# Patient Record
Sex: Male | Born: 2000 | Race: White | Hispanic: No | Marital: Single | Smoking: Current every day smoker
Health system: Southern US, Community
[De-identification: ages and names within clinical notes are randomized; demographics above are authoritative.]

## PROBLEM LIST (undated history)

## (undated) HISTORY — PX: INNER EAR SURGERY: SHX679

---
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
## 2022-04-13 ENCOUNTER — Encounter: Payer: Self-pay | Admitting: Psychiatry

## 2022-04-13 ENCOUNTER — Ambulatory Visit: Payer: 59 | Admitting: Psychiatry

## 2022-04-13 VITALS — BP 124/83 | HR 80 | Ht 75.0 in | Wt 166.0 lb

## 2022-04-13 DIAGNOSIS — F909 Attention-deficit hyperactivity disorder, unspecified type: Secondary | ICD-10-CM | POA: Diagnosis not present

## 2022-04-13 DIAGNOSIS — F411 Generalized anxiety disorder: Secondary | ICD-10-CM

## 2022-04-13 DIAGNOSIS — F1220 Cannabis dependence, uncomplicated: Secondary | ICD-10-CM

## 2022-04-13 MED ORDER — ESCITALOPRAM OXALATE 10 MG PO TABS: ORAL_TABLET | ORAL | 1 refills | Status: DC
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
## 2022-04-14 ENCOUNTER — Encounter: Payer: Self-pay | Admitting: Psychiatry

## 2022-05-11 ENCOUNTER — Ambulatory Visit (INDEPENDENT_AMBULATORY_CARE_PROVIDER_SITE_OTHER): Payer: Self-pay | Admitting: Psychiatry

## 2022-05-11 ENCOUNTER — Other Ambulatory Visit: Payer: Self-pay | Admitting: Psychiatry

## 2022-05-11 DIAGNOSIS — F411 Generalized anxiety disorder: Secondary | ICD-10-CM

## 2022-05-11 NOTE — Progress Notes (Signed)
No show

## 2022-06-28 ENCOUNTER — Encounter (HOSPITAL_COMMUNITY): Payer: Self-pay | Admitting: Emergency Medicine

## 2022-06-28 ENCOUNTER — Emergency Department (HOSPITAL_COMMUNITY)
Admission: EM | Admit: 2022-06-28 | Discharge: 2022-06-29 | Disposition: A | Discharge status: Home / Self Care | Payer: Managed Care, Other (non HMO) | Attending: Emergency Medicine | Admitting: Emergency Medicine

## 2022-06-28 ENCOUNTER — Emergency Department (HOSPITAL_COMMUNITY): Payer: Managed Care, Other (non HMO)

## 2022-06-28 ENCOUNTER — Other Ambulatory Visit: Payer: Self-pay

## 2022-06-28 DIAGNOSIS — R930 Abnormal findings on diagnostic imaging of skull and head, not elsewhere classified: Secondary | ICD-10-CM | POA: Diagnosis not present

## 2022-06-28 DIAGNOSIS — Y92410 Unspecified street and highway as the place of occurrence of the external cause: Secondary | ICD-10-CM | POA: Insufficient documentation

## 2022-06-28 DIAGNOSIS — R079 Chest pain, unspecified: Secondary | ICD-10-CM | POA: Insufficient documentation

## 2022-06-28 DIAGNOSIS — M25552 Pain in left hip: Secondary | ICD-10-CM | POA: Insufficient documentation

## 2022-06-28 DIAGNOSIS — D72829 Elevated white blood cell count, unspecified: Secondary | ICD-10-CM | POA: Diagnosis not present

## 2022-06-28 DIAGNOSIS — S301XXA Contusion of abdominal wall, initial encounter: Secondary | ICD-10-CM | POA: Diagnosis not present

## 2022-06-28 DIAGNOSIS — M25551 Pain in right hip: Secondary | ICD-10-CM | POA: Insufficient documentation

## 2022-06-28 DIAGNOSIS — S3991XA Unspecified injury of abdomen, initial encounter: Secondary | ICD-10-CM | POA: Diagnosis present

## 2022-06-28 MED ORDER — ONDANSETRON HCL 4 MG/2ML IJ SOLN
4.0000 mg | Freq: Once | INTRAMUSCULAR | Status: AC
  Administered 2022-06-29: 4 mg via INTRAVENOUS
  Filled 2022-06-28: qty 2

## 2022-06-28 MED ORDER — MORPHINE SULFATE (PF) 4 MG/ML IV SOLN
4.0000 mg | Freq: Once | INTRAVENOUS | Status: AC
  Administered 2022-06-29: 4 mg via INTRAVENOUS
  Filled 2022-06-28: qty 1
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
## 2022-06-29 ENCOUNTER — Emergency Department (HOSPITAL_COMMUNITY): Payer: Managed Care, Other (non HMO)

## 2022-06-29 DIAGNOSIS — S301XXA Contusion of abdominal wall, initial encounter: Secondary | ICD-10-CM | POA: Diagnosis not present

## 2022-06-29 LAB — COMPREHENSIVE METABOLIC PANEL
ALT: 18 U/L (ref 0–44)
AST: 28 U/L (ref 15–41)
Albumin: 4.1 g/dL (ref 3.5–5.0)
Alkaline Phosphatase: 65 U/L (ref 38–126)
Anion gap: 11 (ref 5–15)
BUN: 13 mg/dL (ref 6–20)
CO2: 23 mmol/L (ref 22–32)
Calcium: 9.2 mg/dL (ref 8.9–10.3)
Chloride: 105 mmol/L (ref 98–111)
Creatinine, Ser: 0.98 mg/dL (ref 0.61–1.24)
GFR, Estimated: >60 mL/min (ref >60)
Glucose, Bld: 97 mg/dL (ref 70–99)
Potassium: 3.4 mmol/L — ABNORMAL LOW (ref 3.5–5.1)
Sodium: 139 mmol/L (ref 135–145)
Total Bilirubin: 1.4 mg/dL — ABNORMAL HIGH (ref 0.3–1.2)
Total Protein: 6.5 g/dL (ref 6.5–8.1)

## 2022-06-29 LAB — URINALYSIS, ROUTINE W REFLEX MICROSCOPIC
Bacteria, UA: NONE SEEN
Bilirubin Urine: NEGATIVE
Glucose, UA: NEGATIVE mg/dL
Ketones, ur: NEGATIVE mg/dL
Leukocytes,Ua: NEGATIVE
Nitrite: NEGATIVE
Protein, ur: NEGATIVE mg/dL
Specific Gravity, Urine: 1.01 (ref 1.005–1.030)
pH: 7 (ref 5.0–8.0)

## 2022-06-29 LAB — PROTIME-INR
INR: 1.1 (ref 0.8–1.2)
Prothrombin Time: 14 seconds (ref 11.4–15.2)

## 2022-06-29 LAB — CBC
HCT: 40 % (ref 39.0–52.0)
Hemoglobin: 13.3 g/dL (ref 13.0–17.0)
MCH: 29.9 pg (ref 26.0–34.0)
MCHC: 33.3 g/dL (ref 30.0–36.0)
MCV: 89.9 fL (ref 80.0–100.0)
Platelets: 215 10*3/uL (ref 150–400)
RBC: 4.45 MIL/uL (ref 4.22–5.81)
RDW: 13 % (ref 11.5–15.5)
WBC: 15.5 10*3/uL — ABNORMAL HIGH (ref 4.0–10.5)
nRBC: 0 % (ref 0.0–0.2)

## 2022-06-29 LAB — I-STAT CHEM 8, ED
BUN: 13 mg/dL (ref 6–20)
Calcium, Ion: 1.16 mmol/L (ref 1.15–1.40)
Chloride: 106 mmol/L (ref 98–111)
Creatinine, Ser: 0.8 mg/dL (ref 0.61–1.24)
Glucose, Bld: 90 mg/dL (ref 70–99)
HCT: 41 % (ref 39.0–52.0)
Hemoglobin: 13.9 g/dL (ref 13.0–17.0)
Potassium: 3.4 mmol/L — ABNORMAL LOW (ref 3.5–5.1)
Sodium: 141 mmol/L (ref 135–145)
TCO2: 24 mmol/L (ref 22–32)

## 2022-06-29 LAB — SAMPLE TO BLOOD BANK

## 2022-06-29 LAB — LACTIC ACID, PLASMA: Lactic Acid, Venous: 1 mmol/L (ref 0.5–1.9)

## 2022-06-29 MED ORDER — IOHEXOL 350 MG/ML SOLN
75.0000 mL | Freq: Once | INTRAVENOUS | Status: AC | PRN
  Administered 2022-06-29: 75 mL via INTRAVENOUS

## 2022-06-29 MED ORDER — IBUPROFEN 800 MG PO TABS
800.0000 mg | ORAL_TABLET | Freq: Once | ORAL | Status: AC
  Administered 2022-06-29: 800 mg via ORAL
  Filled 2022-06-29: qty 1

## 2022-06-29 NOTE — ED Notes (Signed)
Pt provided with AVS.  Education complete; all questions answered.  Pt leaving ED in stable condition at this time via wheelchair with all belongings. 
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
## 2023-03-19 ENCOUNTER — Ambulatory Visit: Payer: 59 | Admitting: Psychiatry

## 2023-03-29 ENCOUNTER — Ambulatory Visit (INDEPENDENT_AMBULATORY_CARE_PROVIDER_SITE_OTHER): Payer: Self-pay | Admitting: Psychiatry

## 2023-03-29 DIAGNOSIS — F411 Generalized anxiety disorder: Secondary | ICD-10-CM

## 2023-03-30 NOTE — Progress Notes (Signed)
No show

## 2023-04-04 ENCOUNTER — Encounter: Payer: Self-pay | Admitting: Psychiatry

## 2023-04-04 ENCOUNTER — Ambulatory Visit: Payer: 59 | Admitting: Psychiatry

## 2023-04-04 DIAGNOSIS — F1220 Cannabis dependence, uncomplicated: Secondary | ICD-10-CM | POA: Diagnosis not present

## 2023-04-04 DIAGNOSIS — F909 Attention-deficit hyperactivity disorder, unspecified type: Secondary | ICD-10-CM | POA: Diagnosis not present

## 2023-04-04 DIAGNOSIS — F411 Generalized anxiety disorder: Secondary | ICD-10-CM

## 2023-04-04 MED ORDER — LISDEXAMFETAMINE DIMESYLATE 20 MG PO CAPS: 20.0000 mg | ORAL_CAPSULE | Freq: Every day | ORAL | 0 refills | Status: DC

## 2023-04-04 NOTE — Progress Notes (Signed)
[facility] [address], [place]   Follow-up visit  Date of Service: 04/04/2023  CC/Purpose: Routine medication management follow up.    Jackson Davis is a 23 y.o. male with a past psychiatric history of ADHD, anxiety who presents today for a psychiatric follow up appointment.    The patient was last seen on 04/13/22, at which time the following plan was established: Medication management:             - Start Lexapro 5mg  daily for one week then increase to 10mg  daily _______________________________________________________________________________________ Acute events/encounters since last visit: denies    Jackson Davis presents to clinic alone. Jackson Davis states that things have been okay for him. Jackson Davis tried Lexapro but didn't notice any change so Jackson Davis stopped taking this. 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 Jackson Davis states that Jackson Davis has started to use less THC, now only some days per week. Jackson Davis continues to feel high amounts of social anxiety and focus challenges. Jackson Davis would like to try a medicine for ADHD, as this seems to have helped anxiety and his focus in the past. No SI/HI/AVH.    Sleep: stable Appetite: Stable Depression: denies Bipolar symptoms:  denies Current suicidal/homicidal ideations:  denied Current auditory/visual hallucinations:  denied    Non-Suicidal Self-Injury: denies Suicide Attempt History: denies  Psychotherapy: denies Previous psychiatric medication trials:  Wellbutrin    Substance use reviewed with pt, with pertinent items below: Alcohol use socially. 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  General Appearance: Neat and Well Groomed  Eye Contact:  Fair  Speech:  Clear and Coherent and Normal Rate  Mood:  Euthymic  Affect:  Appropriate  Thought Process:  Goal Directed  Orientation:  Full (Time, Place, and Person)  Thought Content:  Logical  Suicidal Thoughts:  No  Homicidal Thoughts:  No  Memory:  Immediate;   Good  Judgement:  Good  Insight:  Good  Psychomotor Activity:  Normal  Concentration:  Concentration: Good  Recall:  Good  Fund of Knowledge:  Good  Language:  Good  Assets:  Communication Skills Desire for Improvement Financial Resources/Insurance Housing Leisure Time Physical Health Resilience Social Support Talents/Skills Transportation Vocational/Educational  Cognition:  WNL      Assessment   Psychiatric Diagnoses:   ICD-10-CM   1. Generalized anxiety disorder  F41.1     2. Attention deficit hyperactivity disorder (ADHD), unspecified ADHD type  F90.9     3. Cannabis use disorder, moderate, dependence (HCC)  F12.20       Patient complexity: Moderate   Patient Education and Counseling:  Supportive therapy provided for identified psychosocial stressors.  Medication education provided and decisions regarding medication regimen discussed with patient/guardian.   On  assessment today, Herve has not taken medicine lately. Jackson Davis reports that his cannabis use has improved. Jackson Davis continues to endorse symptoms of anxiety and ADHD. At this time we will try to manage his ADHD and monitor if this impacts his anxiety levels. No SI/HI/AVH.    Plan  Medication  management:  - Start Vyvanse 20mg  daily  Labs/Studies:  - none  Additional recommendations:  - Crisis plan reviewed and patient verbally contracts for safety. Go to ED with emergent symptoms or safety concerns and Risks, benefits, side effects of medications, including any / all black box warnings, discussed with patient, who verbalizes their understanding   Follow Up: Return in 1 month - Call in the interim for any side-effects, decompensation, questions, or problems between now and the next visit.   I have spent 30 minutes reviewing the patients chart, meeting with the patient and family, and reviewing medicines and side effects.   [name], MD [facility]

## 2023-05-03 ENCOUNTER — Ambulatory Visit (INDEPENDENT_AMBULATORY_CARE_PROVIDER_SITE_OTHER): Payer: 59 | Admitting: Psychiatry

## 2023-05-03 ENCOUNTER — Encounter: Payer: Self-pay | Admitting: Psychiatry

## 2023-05-03 DIAGNOSIS — F411 Generalized anxiety disorder: Secondary | ICD-10-CM | POA: Diagnosis not present

## 2023-05-03 DIAGNOSIS — F909 Attention-deficit hyperactivity disorder, unspecified type: Secondary | ICD-10-CM

## 2023-05-03 MED ORDER — PROPRANOLOL HCL 10 MG PO TABS: 10.0000 mg | ORAL_TABLET | Freq: Three times a day (TID) | ORAL | 1 refills | Status: AC | PRN

## 2023-05-03 MED ORDER — LISDEXAMFETAMINE DIMESYLATE 30 MG PO CAPS: 30.0000 mg | ORAL_CAPSULE | Freq: Every day | ORAL | 0 refills | Status: DC

## 2023-05-03 NOTE — Progress Notes (Signed)
 [facility] [address], [place]   Follow-up visit  Date of Service: 05/03/2023  CC/Purpose: Routine medication management follow up.    Jackson Davis is a 23 y.o. male with a past psychiatric history of ADHD, anxiety who presents today for a psychiatric follow up appointment.    The patient was last seen on 04/04/23, at which time the following plan was established: Medication management:             - Start Vyvanse 20mg  daily _______________________________________________________________________________________ Acute events/encounters since last visit: denies    Mikael presents to clinic alone. He hasn't noticed any major benefit from Vyvanse so far. He feels a slight improvement in his focus, but has also had some decreased libido from it. He is okay with staying on it and trying a higher dose. He does have some continued anxiety. 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 He feels anxious in these moments. He is okay with trying a prn medicine. No SI/HI/AVH.    Sleep: stable Appetite: Stable Depression: denies Bipolar symptoms:  denies Current suicidal/homicidal ideations:  denied Current auditory/visual hallucinations:  denied    Non-Suicidal Self-Injury: denies Suicide Attempt History: denies  Psychotherapy: denies Previous psychiatric medication trials:  Wellbutrin    Substance use reviewed with pt, with pertinent items below: Alcohol use socially. 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  General Appearance: Neat and Well Groomed  Eye Contact:  Fair  Speech:  Clear and Coherent and Normal Rate  Mood:  Euthymic  Affect:  Appropriate  Thought Process:  Goal Directed  Orientation:  Full (Time, Place, and Person)  Thought Content:  Logical  Suicidal Thoughts:  No  Homicidal Thoughts:  No  Memory:  Immediate;   Good  Judgement:  Good  Insight:  Good  Psychomotor Activity:  Normal  Concentration:  Concentration: Good  Recall:  Good  Fund of Knowledge:  Good  Language:  Good  Assets:  Communication Skills Desire for Improvement Financial Resources/Insurance Housing Leisure Time Physical Health Resilience Social Support Talents/Skills Transportation Vocational/Educational  Cognition:  WNL      Assessment   Psychiatric Diagnoses:   ICD-10-CM   1. Generalized anxiety disorder  F41.1     2. Attention deficit hyperactivity disorder (ADHD), unspecified ADHD type  F90.9        Patient complexity: Moderate   Patient Education and Counseling:  Supportive therapy provided for identified psychosocial stressors.  Medication education provided and decisions regarding medication regimen discussed with patient/guardian.   On assessment today, [name] has shown a partial response to Vyvanse, with some side effects noted as well. He struggles with decreased libido from this medicine. He also has some continued performance anxiety. We will  make the adjustments below. No SI/HI/AVH.    Plan  Medication management:  - Increase Vyvanse to 30mg  daily  - Start propranolol 10mg  TID prn for anxiety  Labs/Studies:  - none  Additional recommendations:  - Crisis plan reviewed and  patient verbally contracts for safety. Go to ED with emergent symptoms or safety concerns and Risks, benefits, side effects of medications, including any / all black box warnings, discussed with patient, who verbalizes their understanding   Follow Up: Return in 1 month - Call in the interim for any side-effects, decompensation, questions, or problems between now and the next visit.   I have spent 30 minutes reviewing the patients chart, meeting with the patient and family, and reviewing medicines and side effects.   [name], MD [facility]

## 2023-05-27 ENCOUNTER — Other Ambulatory Visit: Payer: Self-pay | Admitting: Psychiatry

## 2023-05-29 ENCOUNTER — Telehealth: Payer: Self-pay

## 2023-06-04 NOTE — Telephone Encounter (Signed)
 I'll discuss it with him at his upcoming appointment

## 2023-06-08 ENCOUNTER — Encounter: Payer: Self-pay | Admitting: Psychiatry

## 2023-06-08 ENCOUNTER — Ambulatory Visit: Payer: 59 | Admitting: Psychiatry

## 2023-06-08 DIAGNOSIS — F909 Attention-deficit hyperactivity disorder, unspecified type: Secondary | ICD-10-CM | POA: Diagnosis not present

## 2023-06-08 DIAGNOSIS — F1220 Cannabis dependence, uncomplicated: Secondary | ICD-10-CM

## 2023-06-08 DIAGNOSIS — F411 Generalized anxiety disorder: Secondary | ICD-10-CM | POA: Diagnosis not present

## 2023-06-08 MED ORDER — LISDEXAMFETAMINE DIMESYLATE 40 MG PO CAPS: 40.0000 mg | ORAL_CAPSULE | Freq: Every day | ORAL | 0 refills | Status: DC

## 2023-06-08 MED ORDER — LISDEXAMFETAMINE DIMESYLATE 40 MG PO CAPS
40.0000 mg | ORAL_CAPSULE | ORAL | 0 refills | Status: DC
Start: 2023-07-06 — End: 2023-12-12

## 2023-06-08 NOTE — Progress Notes (Signed)
 [facility] [address], [place]   Follow-up visit  Date of Service: 06/08/2023  CC/Purpose: Routine medication management follow up.    Jackson Davis is a 23 y.o. male with a past psychiatric history of ADHD, anxiety who presents today for a psychiatric follow up appointment.    The patient was last seen on 05/03/23, at which time the following plan was established: Medication management:             - Increase Vyvanse to 30mg  daily             - Start propranolol 10mg  TID prn for anxiety _______________________________________________________________________________________ Acute events/encounters since last visit: denies    Jackson Davis presents to clinic alone. He feels that propranolol has been helpful for his anxiety. His new job is much better with fewer unknown door knocks and he likes this. He feels the Vyvanse doesn't do much to help and is wondering about a higher dose. He tried 60mg  at one point but felt panicked. He does still endorse sexual side effects, but is okay with monitoring this for now. No SI/HI/AVH.    Sleep: stable Appetite: Stable Depression: denies Bipolar symptoms:  denies Current suicidal/homicidal ideations:  denied Current auditory/visual hallucinations:  denied    Non-Suicidal Self-Injury: denies Suicide Attempt History: denies  Psychotherapy: denies Previous psychiatric medication trials:  Wellbutrin    Substance use reviewed with pt, with pertinent items below: Alcohol use socially. 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  General Appearance: Neat and Well Groomed  Eye Contact:  Fair  Speech:  Clear and Coherent and Normal Rate  Mood:  Euthymic  Affect:  Appropriate  Thought Process:  Goal Directed  Orientation:  Full (Time, Place, and Person)  Thought Content:  Logical  Suicidal Thoughts:  No  Homicidal Thoughts:  No  Memory:  Immediate;   Good  Judgement:  Good  Insight:  Good  Psychomotor Activity:  Normal  Concentration:  Concentration: Good  Recall:  Good  Fund of Knowledge:  Good  Language:  Good  Assets:  Communication Skills Desire for Improvement Financial Resources/Insurance Housing Leisure Time Physical Health Resilience Social Support Talents/Skills Transportation Vocational/Educational  Cognition:  WNL      Assessment   Psychiatric Diagnoses:   ICD-10-CM   1. Generalized anxiety disorder  F41.1     2. Attention deficit hyperactivity disorder (ADHD), unspecified ADHD type  F90.9     3. Cannabis use disorder, moderate, dependence (HCC)  F12.20       Patient complexity: Moderate   Patient Education and Counseling:  Supportive therapy provided for identified psychosocial stressors.  Medication education provided and decisions regarding medication regimen discussed with patient/guardian.   On assessment today, Jackson Davis has had a positive response to propranolol. His anxiety  appears a bit more managed. He still hasn't responded perfectly to Vyvanse so we will try a higher dose. We will monitor his sexual side effects. No SI/HI/AVH.    Plan  Medication management:  - Increase Vyvanse to 40mg  daily  - Propranolol 10mg  TID prn for anxiety  Labs/Studies:  -  none  Additional recommendations:  - Crisis plan reviewed and patient verbally contracts for safety. Go to ED with emergent symptoms or safety concerns and Risks, benefits, side effects of medications, including any / all black box warnings, discussed with patient, who verbalizes their understanding   Follow Up: Return in 2 months - Call in the interim for any side-effects, decompensation, questions, or problems between now and the next visit.   I have spent 30 minutes reviewing the patients chart, meeting with the patient and family, and reviewing medicines and side effects.   Jackson Hymen, MD [facility]

## 2023-08-08 ENCOUNTER — Ambulatory Visit (INDEPENDENT_AMBULATORY_CARE_PROVIDER_SITE_OTHER): Payer: Self-pay | Admitting: Psychiatry

## 2023-08-08 DIAGNOSIS — F909 Attention-deficit hyperactivity disorder, unspecified type: Secondary | ICD-10-CM

## 2023-08-10 NOTE — Progress Notes (Signed)
 No show

## 2023-08-16 ENCOUNTER — Ambulatory Visit (INDEPENDENT_AMBULATORY_CARE_PROVIDER_SITE_OTHER): Payer: Self-pay | Admitting: Psychiatry

## 2023-08-16 DIAGNOSIS — F909 Attention-deficit hyperactivity disorder, unspecified type: Secondary | ICD-10-CM

## 2023-08-17 NOTE — Progress Notes (Signed)
 No show

## 2023-12-12 ENCOUNTER — Telehealth (INDEPENDENT_AMBULATORY_CARE_PROVIDER_SITE_OTHER): Admitting: Psychiatry

## 2023-12-12 DIAGNOSIS — F909 Attention-deficit hyperactivity disorder, unspecified type: Secondary | ICD-10-CM | POA: Diagnosis not present

## 2023-12-12 DIAGNOSIS — F411 Generalized anxiety disorder: Secondary | ICD-10-CM

## 2023-12-12 DIAGNOSIS — F1220 Cannabis dependence, uncomplicated: Secondary | ICD-10-CM | POA: Diagnosis not present

## 2023-12-12 MED ORDER — LISDEXAMFETAMINE DIMESYLATE 30 MG PO CAPS: 30.0000 mg | ORAL_CAPSULE | Freq: Every day | ORAL | 0 refills | Status: AC

## 2023-12-13 ENCOUNTER — Encounter: Payer: Self-pay | Admitting: Psychiatry

## 2023-12-13 NOTE — Progress Notes (Signed)
 [facility] [address], [place]   Follow-up visit  Date of Service: 12/12/2023  CC/Purpose: Routine medication management follow up. Virtual Visit via Video Note  I connected with pt @ on 12/12/2023 at  5:00 PM EDT by a video enabled telemedicine application and verified that I am speaking with the correct person using two identifiers.   I discussed the limitations of evaluation and management by telemedicine and the availability of in person appointments. The patient expressed understanding and agreed to proceed.  I discussed the assessment and treatment plan with the patient. The patient was provided an opportunity to ask questions and all were answered. The patient agreed with the plan and demonstrated an understanding of the instructions.   The patient was advised to call back or seek an in-person evaluation if the symptoms worsen or if the condition fails to improve as anticipated.  I provided 25 minutes of non-face-to-face time during this encounter.  The patient was located at home.  The provider was located at [facility].   Jackson Davis S Jackson Ellegood, MD    Jackson Davis is a 23 y.o. male with a past psychiatric history of ADHD, anxiety who presents today for a psychiatric follow up appointment.    The patient was last seen on 06/08/23, at which time the following plan was established: Medication management:             - Increase Vyvanse  to 40mg  daily             - Propranolol  10mg  TID prn for anxiety _______________________________________________________________________________________ Acute events/encounters since last visit: denies    Jackson Davis presents virtually. He reports that things have been going okay. He has not been taking the medicine due to running out. He is now working by [employer] and doing [employer]. He is also in a class for pre-licensing for real estate. He feels his focus is an issue in this class, and  wants to restart Vyvanse . He would like to try a lower dose as the higher dose had some side effects. He feels his anxiety is manageable for now. He reports less THC and alcohol use. No SI/HI/AVH.    Sleep: stable Appetite: Stable Depression: denies Bipolar symptoms:  denies Current suicidal/homicidal ideations:  denied Current auditory/visual hallucinations:  denied    Non-Suicidal Self-Injury: denies Suicide Attempt History: denies  Psychotherapy: denies Previous psychiatric medication trials:  Wellbutrin    Substance use reviewed with pt, with pertinent items below: Alcohol use socially. 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  General Appearance: Neat and Well Groomed  Eye Contact:  Fair  Speech:  Clear and Coherent and Normal Rate  Mood:  Euthymic  Affect:  Appropriate  Thought Process:  Goal  Directed  Orientation:  Full (Time, Place, and Person)  Thought Content:  Logical  Suicidal Thoughts:  No  Homicidal Thoughts:  No  Memory:  Immediate;   Good  Judgement:  Good  Insight:  Good  Psychomotor Activity:  Normal  Concentration:  Concentration: Good  Recall:  Good  Fund of Knowledge:  Good  Language:  Good  Assets:  Communication  Skills Desire for Improvement Financial Resources/Insurance Housing Leisure Time Physical Health Resilience Social Support Talents/Skills Transportation Vocational/Educational  Cognition:  WNL      Assessment   Psychiatric Diagnoses:   ICD-10-CM   1. Attention deficit hyperactivity disorder (ADHD), unspecified ADHD type  F90.9     2. Generalized anxiety disorder  F41.1     3. Cannabis use disorder, moderate, dependence (HCC)  F12.20        Patient complexity: Moderate   Patient Education and Counseling:  Supportive therapy provided for identified psychosocial stressors.  Medication education provided and decisions regarding medication regimen discussed with patient/guardian.   On assessment today, Ran has been off medicine for several months. We will restart a lower dose of Vyvanse  for focus and will monitor his mood, anxiety, and focus. No other concerns today. No SI/HI/AVH.    Plan  Medication management:  - Decrease Vyvanse  to 30mg  daily  Labs/Studies:  - none  Additional recommendations:  - Crisis plan reviewed and patient verbally contracts for safety. Go to ED with emergent symptoms or safety concerns and Risks, benefits, side effects of medications, including any / all black box warnings, discussed with patient, who verbalizes their understanding   Follow Up: Return in 3 months - Call in the interim for any side-effects, decompensation, questions, or problems between now and the next visit.   I have spent 25 minutes reviewing the patients chart, meeting with the patient and family, and reviewing medicines and side effects.   Selinda GORMAN Lauth, MD [facility]

## 2024-03-10 ENCOUNTER — Ambulatory Visit: Admitting: Psychiatry
